# Patient Record
Sex: Male | Born: 1940 | Race: White | Hispanic: No | Marital: Married
Health system: Southern US, Community
[De-identification: ages and names within clinical notes are randomized; demographics above are authoritative.]

## PROBLEM LIST (undated history)

## (undated) HISTORY — PX: TONSILLECTOMY: SUR1361

---

## 2015-06-08 ENCOUNTER — Emergency Department (INDEPENDENT_AMBULATORY_CARE_PROVIDER_SITE_OTHER)
Admission: EM | Admit: 2015-06-08 | Discharge: 2015-06-08 | Disposition: A | Discharge status: Home / Self Care | Payer: Medicare HMO | Source: Home / Self Care | Attending: Emergency Medicine | Admitting: Emergency Medicine

## 2015-06-08 ENCOUNTER — Encounter (HOSPITAL_COMMUNITY): Payer: Self-pay | Admitting: *Deleted

## 2015-06-08 ENCOUNTER — Emergency Department (INDEPENDENT_AMBULATORY_CARE_PROVIDER_SITE_OTHER): Payer: Medicare HMO

## 2015-06-08 DIAGNOSIS — J189 Pneumonia, unspecified organism: Secondary | ICD-10-CM

## 2015-06-08 MED ORDER — LEVOFLOXACIN 500 MG PO TABS: 500.0000 mg | ORAL_TABLET | Freq: Every day | ORAL | Status: AC

## 2015-06-08 NOTE — ED Notes (Signed)
Pt  Reports  Symptoms  Of  sorethroat   Cough  /  Congested  Body  Aches   With onset  Of  Symptoms           Sine  Weds                Sitting  Upright  On  Exam table  Table        Speaking in   Complete     sentances

## 2015-06-08 NOTE — ED Provider Notes (Signed)
CSN: [id number]     Arrival date & time 06/08/15  1938 History   First MD Initiated Contact with Patient 06/08/15 2006     Chief Complaint  Patient presents with  . Sore Throat   (Consider location/radiation/quality/duration/timing/severity/associated sxs/prior Treatment) HPI  He is a 74 year old man here for evaluation of sore throat and fever. He states his symptoms started about 4-5 days ago. He reports nasal congestion, sore throat, and nonproductive cough. He reports fevers up to 103 at home. He reports a decreased appetite. No nausea or vomiting. He denies wheezing or shortness of breath. He tried over-the-counter cold medicines without improvement.  History reviewed. No pertinent past medical history. Past Surgical History  Procedure Laterality Date  . Tonsillectomy     History reviewed. No pertinent family history. Social History  Substance Use Topics  . Smoking status: None  . Smokeless tobacco: None  . Alcohol Use: No    Review of Systems As in history of present illness Allergies  Sulfa antibiotics  Home Medications   Prior to Admission medications   Medication Sig Start Date End Date Taking? Authorizing Provider  ASPIRIN PO Take by mouth.   Yes Historical Provider, MD  Multiple Vitamin (MULTIVITAMIN) tablet Take 1 tablet by mouth daily.   Yes Historical Provider, MD  levofloxacin (LEVAQUIN) 500 MG tablet Take 1 tablet (500 mg total) by mouth daily. 06/08/15   Melony Overly, MD   Meds Ordered and Administered this Visit  Medications - No data to display  BP 141/83 mmHg  Pulse 96  Temp(Src) 100.9 F (38.3 C) (Oral)  Resp 22  SpO2 95% No data found.   Physical Exam  Constitutional: He is oriented to person, place, and time. He appears well-developed and well-nourished. No distress.  HENT:  Nose: Nose normal.  Mouth/Throat: No oropharyngeal exudate.  Pharyngeal erythema  Neck: Neck supple.  Cardiovascular: Normal rate, regular rhythm and normal heart  sounds.   No murmur heard. Pulmonary/Chest: Effort normal and breath sounds normal. No respiratory distress. He has no wheezes. He has no rales.  Lymphadenopathy:    He has no cervical adenopathy.  Neurological: He is alert and oriented to person, place, and time.    ED Course  Procedures (including critical care time)  Labs Review Labs Reviewed - No data to display  Imaging Review No results found.   MDM   1. CAP (community acquired pneumonia)    Chest x-ray pending at time of discharge. On my read there is concern for a bronchopneumonia. Treat with Levaquin. Recommended OTC allergy medicine ingestion. Strict return precautions given. Follow-up with PCP next week for recheck.    Melony Overly, MD 06/08/15 2101

## 2015-06-08 NOTE — Discharge Instructions (Signed)
I am concerned about pneumonia on your x-ray. Take Levaquin daily for 7 days. You can take an over-the-counter allergy medicine such as Zyrtec or Claritin to help with the congestion. Please make an appointment with your primary care doctor next week for a recheck. If you are getting short of breath, your fevers persist, or you start acting funny, please go straight to the emergency room.

## 2015-09-09 DIAGNOSIS — H01001 Unspecified blepharitis right upper eyelid: Secondary | ICD-10-CM | POA: Diagnosis not present

## 2015-09-09 DIAGNOSIS — H01004 Unspecified blepharitis left upper eyelid: Secondary | ICD-10-CM | POA: Diagnosis not present

## 2015-09-12 DIAGNOSIS — H01001 Unspecified blepharitis right upper eyelid: Secondary | ICD-10-CM | POA: Diagnosis not present

## 2015-09-12 DIAGNOSIS — H01004 Unspecified blepharitis left upper eyelid: Secondary | ICD-10-CM | POA: Diagnosis not present

## 2015-09-23 DIAGNOSIS — Z23 Encounter for immunization: Secondary | ICD-10-CM | POA: Diagnosis not present

## 2015-09-23 DIAGNOSIS — Z9181 History of falling: Secondary | ICD-10-CM | POA: Diagnosis not present

## 2015-09-23 DIAGNOSIS — E782 Mixed hyperlipidemia: Secondary | ICD-10-CM | POA: Diagnosis not present

## 2015-09-23 DIAGNOSIS — Z79899 Other long term (current) drug therapy: Secondary | ICD-10-CM | POA: Diagnosis not present

## 2015-09-23 DIAGNOSIS — Z125 Encounter for screening for malignant neoplasm of prostate: Secondary | ICD-10-CM | POA: Diagnosis not present

## 2015-09-23 DIAGNOSIS — Z0000 Encounter for general adult medical examination without abnormal findings: Secondary | ICD-10-CM | POA: Diagnosis not present

## 2015-09-23 DIAGNOSIS — Z1389 Encounter for screening for other disorder: Secondary | ICD-10-CM | POA: Diagnosis not present

## 2015-10-02 DIAGNOSIS — J069 Acute upper respiratory infection, unspecified: Secondary | ICD-10-CM | POA: Diagnosis not present

## 2015-10-02 DIAGNOSIS — B9789 Other viral agents as the cause of diseases classified elsewhere: Secondary | ICD-10-CM | POA: Diagnosis not present

## 2015-10-08 DIAGNOSIS — R69 Illness, unspecified: Secondary | ICD-10-CM | POA: Diagnosis not present

## 2015-10-29 DIAGNOSIS — L570 Actinic keratosis: Secondary | ICD-10-CM | POA: Diagnosis not present

## 2015-12-02 DIAGNOSIS — J309 Allergic rhinitis, unspecified: Secondary | ICD-10-CM | POA: Diagnosis not present

## 2016-06-23 DIAGNOSIS — L570 Actinic keratosis: Secondary | ICD-10-CM | POA: Diagnosis not present

## 2016-07-21 DIAGNOSIS — Z23 Encounter for immunization: Secondary | ICD-10-CM | POA: Diagnosis not present

## 2016-09-08 DIAGNOSIS — J40 Bronchitis, not specified as acute or chronic: Secondary | ICD-10-CM | POA: Diagnosis not present

## 2016-09-08 DIAGNOSIS — J329 Chronic sinusitis, unspecified: Secondary | ICD-10-CM | POA: Diagnosis not present

## 2016-09-14 DIAGNOSIS — H5203 Hypermetropia, bilateral: Secondary | ICD-10-CM | POA: Diagnosis not present

## 2016-09-14 DIAGNOSIS — H2513 Age-related nuclear cataract, bilateral: Secondary | ICD-10-CM | POA: Diagnosis not present

## 2016-09-20 IMAGING — DX DG CHEST 2V
2 series · 2 of 2 positions shown · non-contrast
Comparison: None.

CLINICAL DATA: Acute onset of fever and cough. Decreased O2
saturation. Initial encounter.

EXAM:
CHEST  2 VIEW

[chest pa]
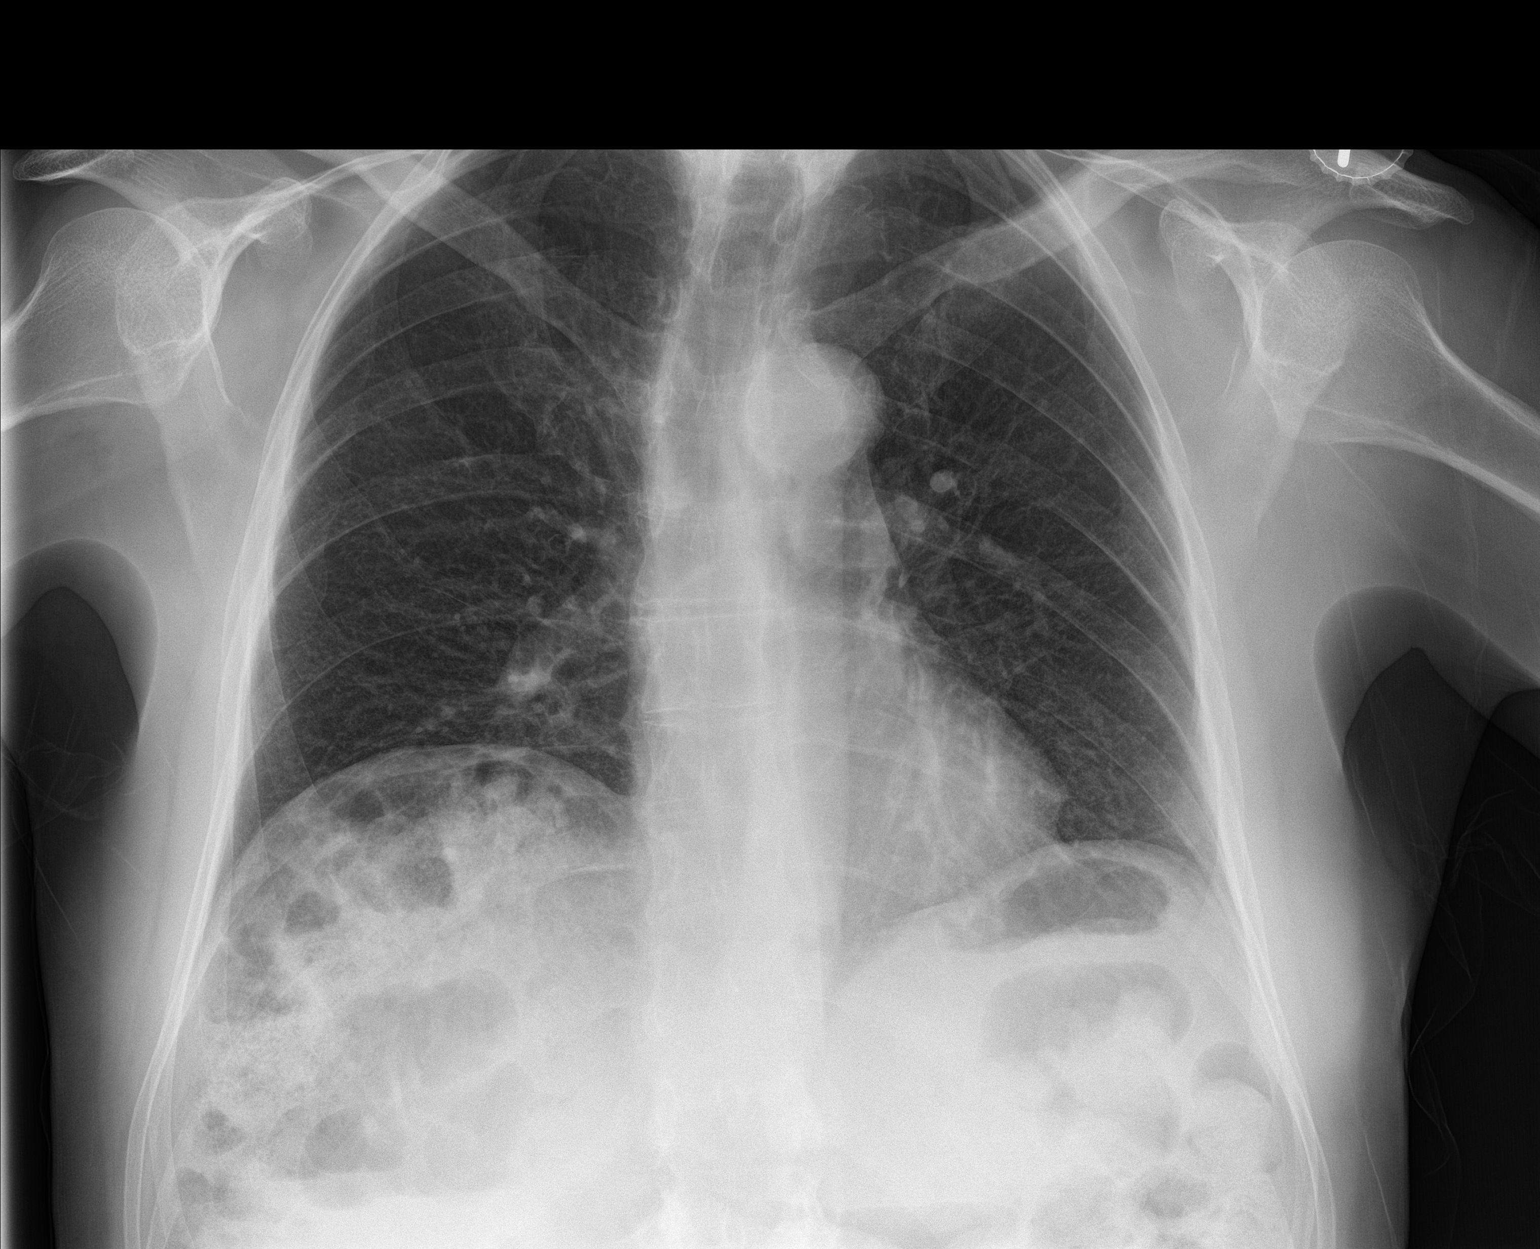

[chest lat]
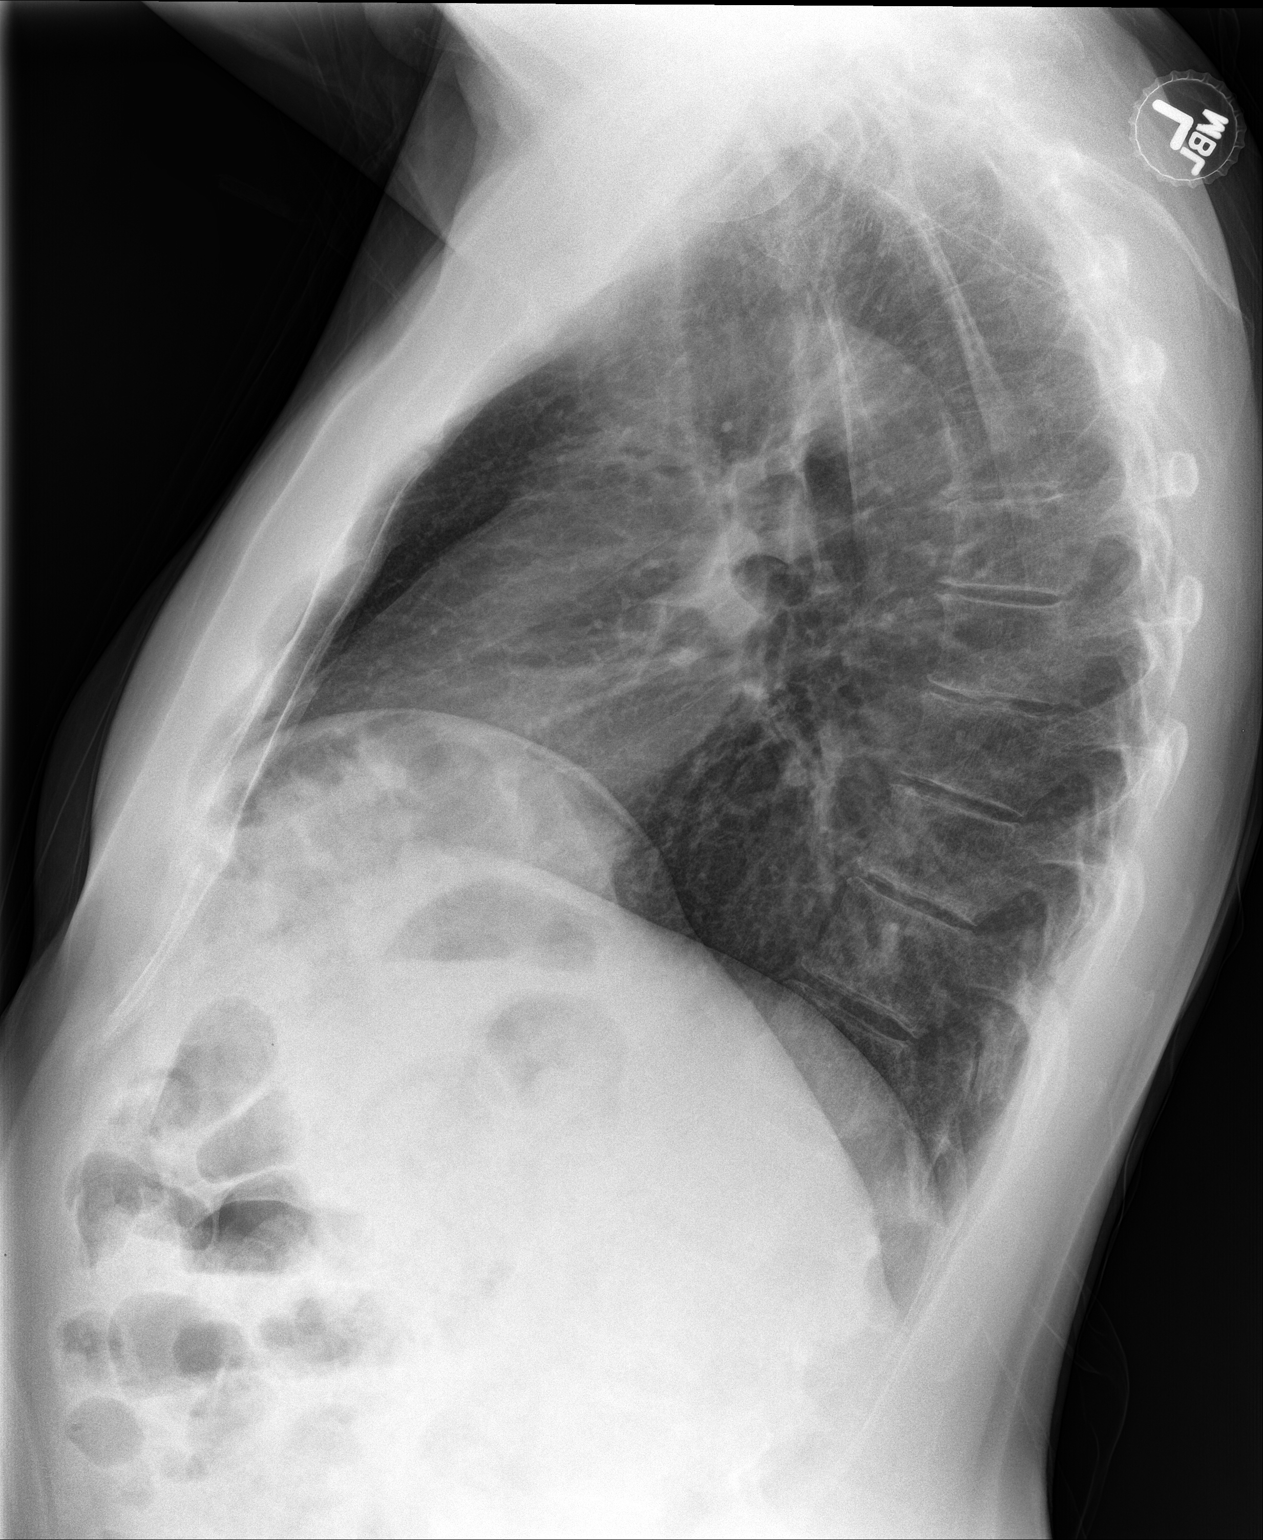

[2 of 2 positions shown; findings below may reference images not displayed]

FINDINGS: The lungs are well-aerated. Minimal bibasilar atelectasis is noted.
There is no evidence of pleural effusion or pneumothorax.

The heart is normal in size; the mediastinal contour is within
normal limits. No acute osseous abnormalities are seen. There is
interposition of colonic loops anterior to the liver.
IMPRESSION: Minimal bibasilar atelectasis noted.  Lungs otherwise clear.

## 2016-09-23 DIAGNOSIS — K219 Gastro-esophageal reflux disease without esophagitis: Secondary | ICD-10-CM | POA: Diagnosis not present

## 2016-09-23 DIAGNOSIS — Z9181 History of falling: Secondary | ICD-10-CM | POA: Diagnosis not present

## 2016-09-23 DIAGNOSIS — Z79899 Other long term (current) drug therapy: Secondary | ICD-10-CM | POA: Diagnosis not present

## 2016-09-23 DIAGNOSIS — E785 Hyperlipidemia, unspecified: Secondary | ICD-10-CM | POA: Diagnosis not present

## 2016-09-23 DIAGNOSIS — J309 Allergic rhinitis, unspecified: Secondary | ICD-10-CM | POA: Diagnosis not present

## 2016-09-23 DIAGNOSIS — Z1389 Encounter for screening for other disorder: Secondary | ICD-10-CM | POA: Diagnosis not present

## 2016-09-23 DIAGNOSIS — Z125 Encounter for screening for malignant neoplasm of prostate: Secondary | ICD-10-CM | POA: Diagnosis not present

## 2016-09-23 DIAGNOSIS — S86011A Strain of right Achilles tendon, initial encounter: Secondary | ICD-10-CM | POA: Diagnosis not present

## 2016-09-23 DIAGNOSIS — Z0000 Encounter for general adult medical examination without abnormal findings: Secondary | ICD-10-CM | POA: Diagnosis not present

## 2016-09-23 DIAGNOSIS — Z6822 Body mass index (BMI) 22.0-22.9, adult: Secondary | ICD-10-CM | POA: Diagnosis not present

## 2016-10-01 DIAGNOSIS — Z0000 Encounter for general adult medical examination without abnormal findings: Secondary | ICD-10-CM | POA: Diagnosis not present

## 2016-10-01 DIAGNOSIS — K219 Gastro-esophageal reflux disease without esophagitis: Secondary | ICD-10-CM | POA: Diagnosis not present

## 2016-10-01 DIAGNOSIS — Z6823 Body mass index (BMI) 23.0-23.9, adult: Secondary | ICD-10-CM | POA: Diagnosis not present

## 2016-11-04 DIAGNOSIS — Z0100 Encounter for examination of eyes and vision without abnormal findings: Secondary | ICD-10-CM | POA: Diagnosis not present

## 2016-11-05 DIAGNOSIS — 419620001 Death: Secondary | SNOMED CT | POA: Diagnosis not present

## 2016-11-05 DEATH — deceased

## 2017-04-13 DIAGNOSIS — I83819 Varicose veins of unspecified lower extremities with pain: Secondary | ICD-10-CM | POA: Diagnosis not present

## 2017-04-13 DIAGNOSIS — M79669 Pain in unspecified lower leg: Secondary | ICD-10-CM | POA: Diagnosis not present

## 2017-04-13 DIAGNOSIS — M7989 Other specified soft tissue disorders: Secondary | ICD-10-CM | POA: Diagnosis not present

## 2017-04-13 DIAGNOSIS — Z6823 Body mass index (BMI) 23.0-23.9, adult: Secondary | ICD-10-CM | POA: Diagnosis not present

## 2017-04-14 ENCOUNTER — Other Ambulatory Visit: Payer: Self-pay | Admitting: Family Medicine

## 2017-04-14 DIAGNOSIS — M79669 Pain in unspecified lower leg: Secondary | ICD-10-CM

## 2017-04-14 DIAGNOSIS — M7989 Other specified soft tissue disorders: Principal | ICD-10-CM

## 2017-04-16 ENCOUNTER — Ambulatory Visit
Admission: RE | Admit: 2017-04-16 | Discharge: 2017-04-16 | Disposition: A | Discharge status: Home / Self Care | Payer: Medicare HMO | Source: Ambulatory Visit | Attending: Family Medicine | Admitting: Family Medicine

## 2017-04-16 DIAGNOSIS — R600 Localized edema: Secondary | ICD-10-CM | POA: Diagnosis not present

## 2017-04-16 DIAGNOSIS — M79669 Pain in unspecified lower leg: Secondary | ICD-10-CM

## 2017-04-16 DIAGNOSIS — I8001 Phlebitis and thrombophlebitis of superficial vessels of right lower extremity: Secondary | ICD-10-CM | POA: Diagnosis not present

## 2017-04-16 DIAGNOSIS — M7989 Other specified soft tissue disorders: Principal | ICD-10-CM

## 2017-04-29 DIAGNOSIS — I8001 Phlebitis and thrombophlebitis of superficial vessels of right lower extremity: Secondary | ICD-10-CM | POA: Diagnosis not present

## 2017-04-29 DIAGNOSIS — I83813 Varicose veins of bilateral lower extremities with pain: Secondary | ICD-10-CM | POA: Diagnosis not present

## 2017-04-29 DIAGNOSIS — I83893 Varicose veins of bilateral lower extremities with other complications: Secondary | ICD-10-CM | POA: Diagnosis not present

## 2017-05-04 DIAGNOSIS — I8001 Phlebitis and thrombophlebitis of superficial vessels of right lower extremity: Secondary | ICD-10-CM | POA: Diagnosis not present

## 2017-05-04 DIAGNOSIS — I83813 Varicose veins of bilateral lower extremities with pain: Secondary | ICD-10-CM | POA: Diagnosis not present

## 2017-05-04 DIAGNOSIS — I83893 Varicose veins of bilateral lower extremities with other complications: Secondary | ICD-10-CM | POA: Diagnosis not present

## 2017-05-25 DIAGNOSIS — I83893 Varicose veins of bilateral lower extremities with other complications: Secondary | ICD-10-CM | POA: Diagnosis not present

## 2017-05-25 DIAGNOSIS — I83813 Varicose veins of bilateral lower extremities with pain: Secondary | ICD-10-CM | POA: Diagnosis not present

## 2017-05-25 DIAGNOSIS — I8312 Varicose veins of left lower extremity with inflammation: Secondary | ICD-10-CM | POA: Diagnosis not present

## 2017-05-25 DIAGNOSIS — I8311 Varicose veins of right lower extremity with inflammation: Secondary | ICD-10-CM | POA: Diagnosis not present

## 2017-06-14 DIAGNOSIS — Z6823 Body mass index (BMI) 23.0-23.9, adult: Secondary | ICD-10-CM | POA: Diagnosis not present

## 2017-06-14 DIAGNOSIS — E785 Hyperlipidemia, unspecified: Secondary | ICD-10-CM | POA: Diagnosis not present

## 2017-06-14 DIAGNOSIS — R509 Fever, unspecified: Secondary | ICD-10-CM | POA: Diagnosis not present

## 2017-06-14 DIAGNOSIS — Z79899 Other long term (current) drug therapy: Secondary | ICD-10-CM | POA: Diagnosis not present

## 2017-06-16 DIAGNOSIS — I8311 Varicose veins of right lower extremity with inflammation: Secondary | ICD-10-CM | POA: Diagnosis not present

## 2017-06-16 DIAGNOSIS — I83891 Varicose veins of right lower extremities with other complications: Secondary | ICD-10-CM | POA: Diagnosis not present

## 2017-06-21 DIAGNOSIS — I8311 Varicose veins of right lower extremity with inflammation: Secondary | ICD-10-CM | POA: Diagnosis not present

## 2017-07-02 DIAGNOSIS — I8311 Varicose veins of right lower extremity with inflammation: Secondary | ICD-10-CM | POA: Diagnosis not present

## 2017-07-02 DIAGNOSIS — I83891 Varicose veins of right lower extremities with other complications: Secondary | ICD-10-CM | POA: Diagnosis not present

## 2017-07-14 DIAGNOSIS — I8311 Varicose veins of right lower extremity with inflammation: Secondary | ICD-10-CM | POA: Diagnosis not present

## 2017-07-14 DIAGNOSIS — I83811 Varicose veins of right lower extremities with pain: Secondary | ICD-10-CM | POA: Diagnosis not present

## 2017-07-29 DIAGNOSIS — I83891 Varicose veins of right lower extremities with other complications: Secondary | ICD-10-CM | POA: Diagnosis not present

## 2017-07-29 DIAGNOSIS — I8311 Varicose veins of right lower extremity with inflammation: Secondary | ICD-10-CM | POA: Diagnosis not present

## 2017-09-03 DIAGNOSIS — Z6823 Body mass index (BMI) 23.0-23.9, adult: Secondary | ICD-10-CM | POA: Diagnosis not present

## 2017-09-03 DIAGNOSIS — S81801A Unspecified open wound, right lower leg, initial encounter: Secondary | ICD-10-CM | POA: Diagnosis not present

## 2017-09-09 DIAGNOSIS — Z23 Encounter for immunization: Secondary | ICD-10-CM | POA: Diagnosis not present

## 2017-09-09 DIAGNOSIS — R69 Illness, unspecified: Secondary | ICD-10-CM | POA: Diagnosis not present

## 2017-09-16 DIAGNOSIS — H5203 Hypermetropia, bilateral: Secondary | ICD-10-CM | POA: Diagnosis not present

## 2017-09-16 DIAGNOSIS — H2513 Age-related nuclear cataract, bilateral: Secondary | ICD-10-CM | POA: Diagnosis not present

## 2017-10-26 DIAGNOSIS — Z79899 Other long term (current) drug therapy: Secondary | ICD-10-CM | POA: Diagnosis not present

## 2017-10-26 DIAGNOSIS — Z9181 History of falling: Secondary | ICD-10-CM | POA: Diagnosis not present

## 2017-10-26 DIAGNOSIS — Z125 Encounter for screening for malignant neoplasm of prostate: Secondary | ICD-10-CM | POA: Diagnosis not present

## 2017-10-26 DIAGNOSIS — Z6822 Body mass index (BMI) 22.0-22.9, adult: Secondary | ICD-10-CM | POA: Diagnosis not present

## 2017-10-26 DIAGNOSIS — Z1331 Encounter for screening for depression: Secondary | ICD-10-CM | POA: Diagnosis not present

## 2017-10-26 DIAGNOSIS — E785 Hyperlipidemia, unspecified: Secondary | ICD-10-CM | POA: Diagnosis not present

## 2017-10-26 DIAGNOSIS — Z0000 Encounter for general adult medical examination without abnormal findings: Secondary | ICD-10-CM | POA: Diagnosis not present

## 2017-10-26 DIAGNOSIS — K219 Gastro-esophageal reflux disease without esophagitis: Secondary | ICD-10-CM | POA: Diagnosis not present

## 2017-10-26 DIAGNOSIS — Z1211 Encounter for screening for malignant neoplasm of colon: Secondary | ICD-10-CM | POA: Diagnosis not present

## 2017-11-02 DIAGNOSIS — R58 Hemorrhage, not elsewhere classified: Secondary | ICD-10-CM | POA: Diagnosis not present

## 2017-11-02 DIAGNOSIS — I8311 Varicose veins of right lower extremity with inflammation: Secondary | ICD-10-CM | POA: Diagnosis not present

## 2017-11-08 DIAGNOSIS — R58 Hemorrhage, not elsewhere classified: Secondary | ICD-10-CM | POA: Diagnosis not present

## 2017-11-08 DIAGNOSIS — I8311 Varicose veins of right lower extremity with inflammation: Secondary | ICD-10-CM | POA: Diagnosis not present

## 2017-11-25 DIAGNOSIS — I8311 Varicose veins of right lower extremity with inflammation: Secondary | ICD-10-CM | POA: Diagnosis not present

## 2017-11-25 DIAGNOSIS — R58 Hemorrhage, not elsewhere classified: Secondary | ICD-10-CM | POA: Diagnosis not present

## 2017-12-30 DIAGNOSIS — I8311 Varicose veins of right lower extremity with inflammation: Secondary | ICD-10-CM | POA: Diagnosis not present

## 2017-12-30 DIAGNOSIS — R58 Hemorrhage, not elsewhere classified: Secondary | ICD-10-CM | POA: Diagnosis not present

## 2018-04-13 DIAGNOSIS — Z1339 Encounter for screening examination for other mental health and behavioral disorders: Secondary | ICD-10-CM | POA: Diagnosis not present

## 2018-04-13 DIAGNOSIS — J309 Allergic rhinitis, unspecified: Secondary | ICD-10-CM | POA: Diagnosis not present

## 2018-04-13 DIAGNOSIS — R69 Illness, unspecified: Secondary | ICD-10-CM | POA: Diagnosis not present

## 2018-04-13 DIAGNOSIS — Z6823 Body mass index (BMI) 23.0-23.9, adult: Secondary | ICD-10-CM | POA: Diagnosis not present

## 2018-04-30 IMAGING — US US EXTREM LOW VENOUS BILAT
1 series · 12 of 24 positions shown · non-contrast
Comparison: None.

CLINICAL DATA: Bilateral lower extremity pain and edema for the
past week. Palpable area involving the right mid calf.



[Series 1: us extrem low venous bilat · 0.09mm/px · 12 of 88 slices shown]
[im 4/88]
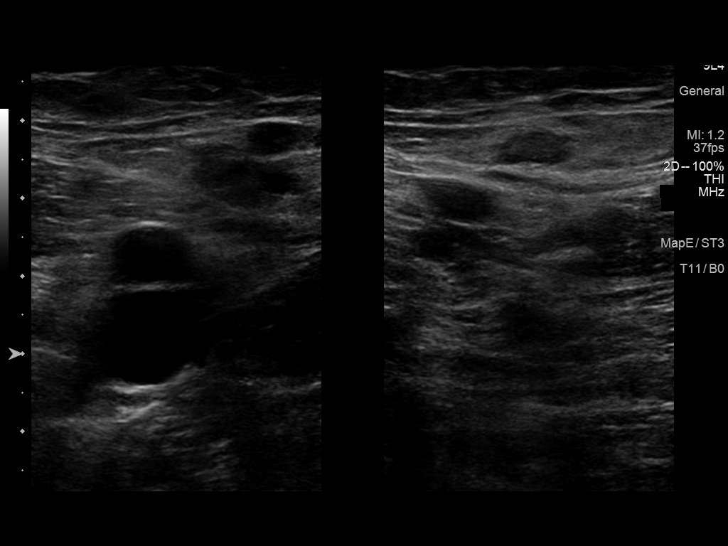
[im 12/88]
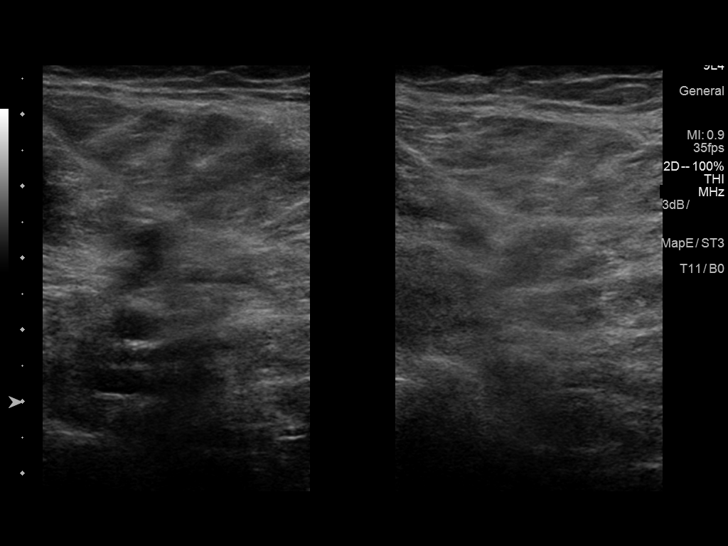
[im 19/88]
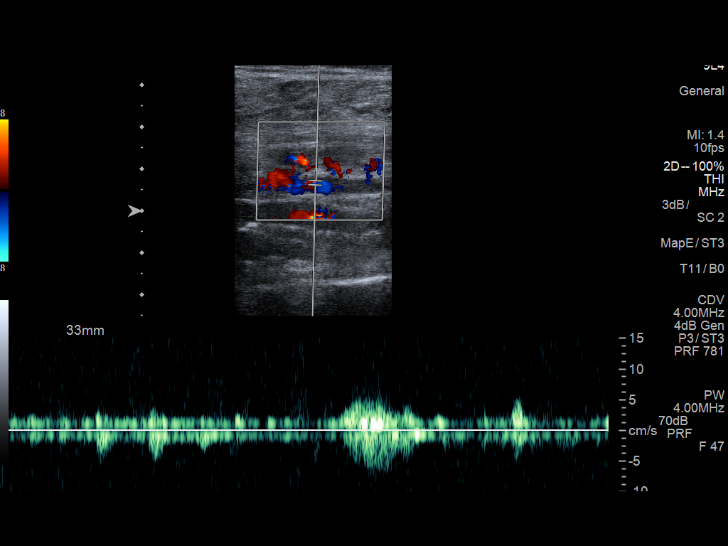
[im 27/88]
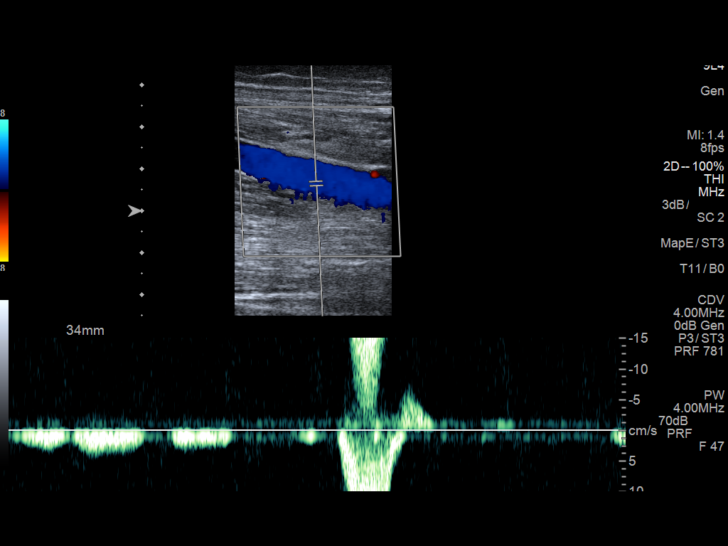
[im 35/88]
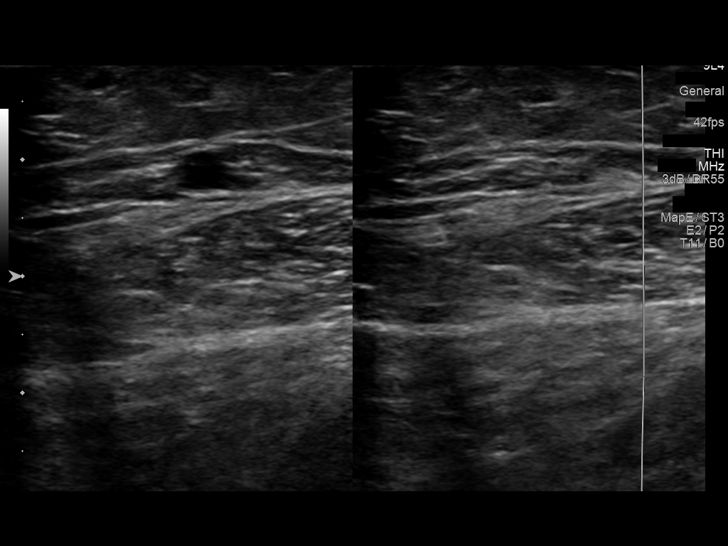
[im 42/88]
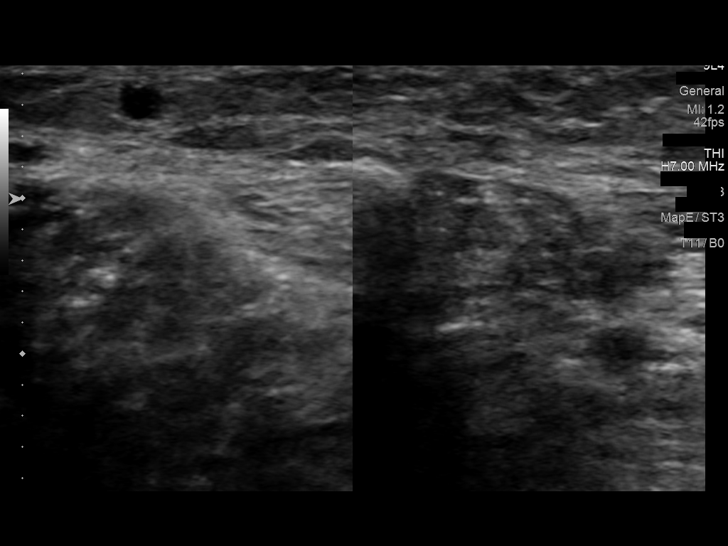
[im 50/88]
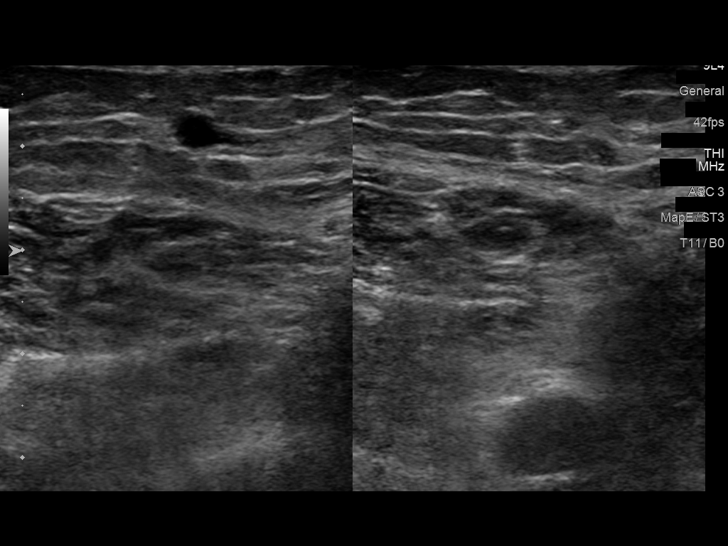
[im 57/88]
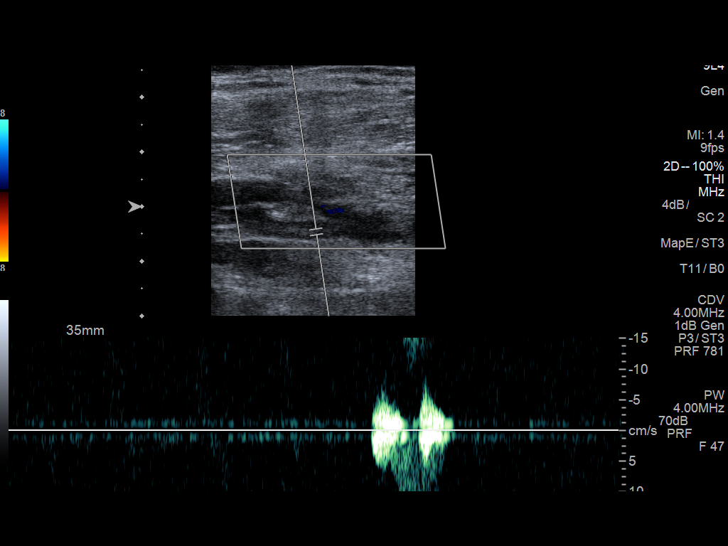
[im 65/88]
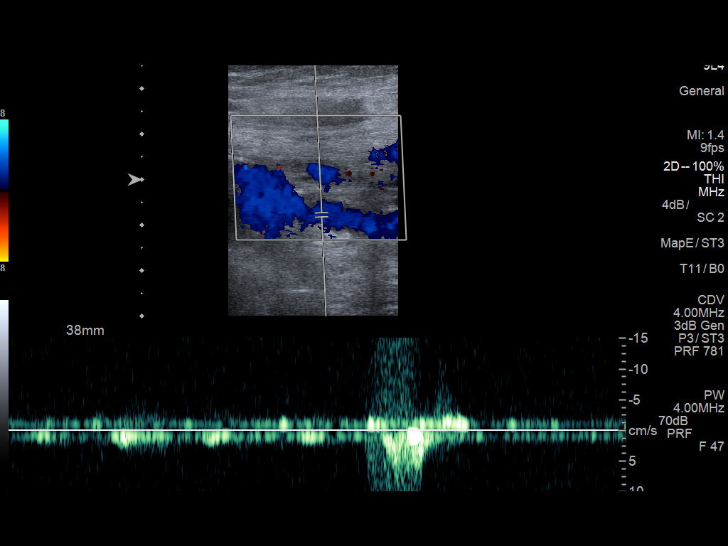
[im 72/88]
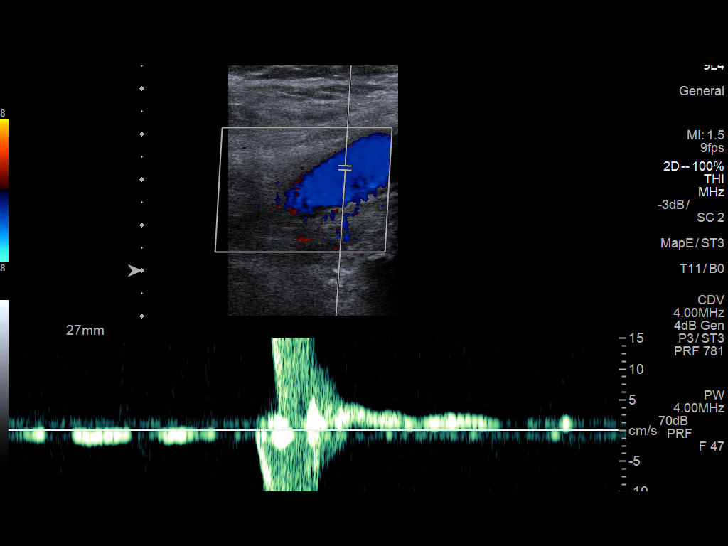
[im 80/88]
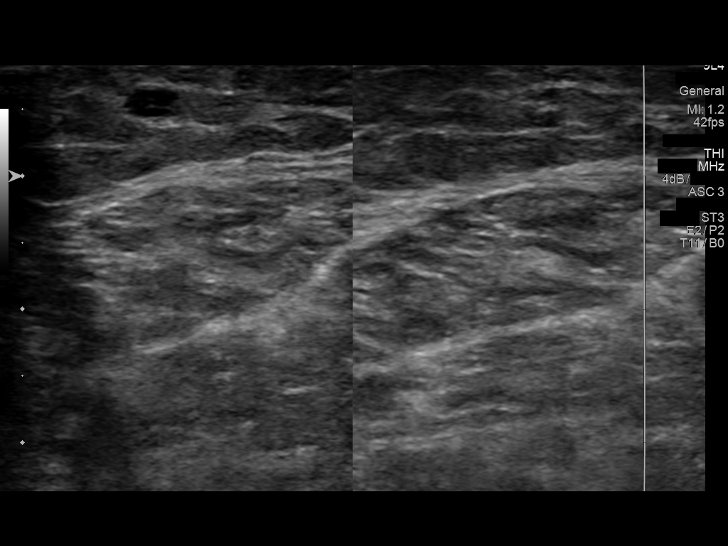
[im 88/88]
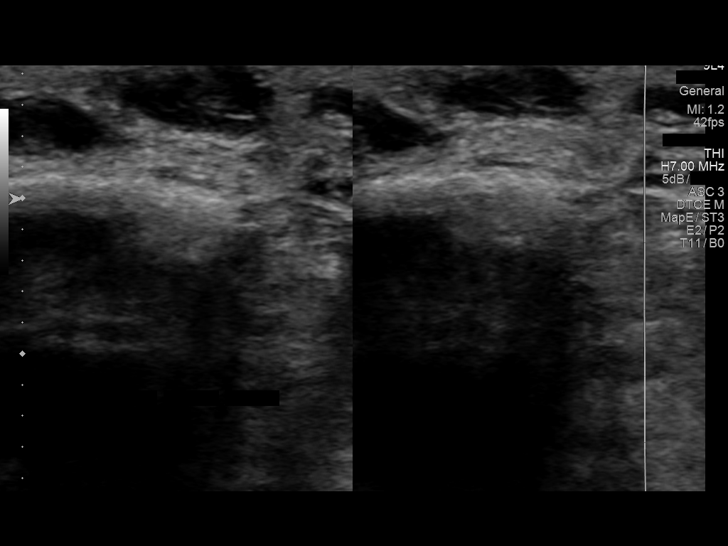

[12 of 24 positions shown; findings below may reference images not displayed]

FINDINGS: RIGHT LOWER EXTREMITY

Common Femoral Vein: No evidence of thrombus. Normal
compressibility, respiratory phasicity and response to augmentation.

Saphenofemoral Junction: No evidence of thrombus. Normal
compressibility and flow on color Doppler imaging.

Profunda Femoral Vein: No evidence of thrombus. Normal
compressibility and flow on color Doppler imaging.

Femoral Vein: No evidence of thrombus. Normal compressibility,
respiratory phasicity and response to augmentation.

Popliteal Vein: No evidence of thrombus. Normal compressibility,
respiratory phasicity and response to augmentation.

Calf Veins: No evidence of thrombus. Normal compressibility and flow
on color Doppler imaging.

Superficial Great Saphenous Vein: No evidence of thrombus. Normal
compressibility and flow on color Doppler imaging.

Venous Reflux:  None.

Other Findings: Mixed echogenic occlusive thrombus is noted within
several superficial varicosities at the level the right mid calf,
correlating with the patient's palpable area of concern
(representative images 90 - 93).

LEFT LOWER EXTREMITY

Common Femoral Vein: No evidence of thrombus. Normal
compressibility, respiratory phasicity and response to augmentation.

Saphenofemoral Junction: No evidence of thrombus. Normal
compressibility and flow on color Doppler imaging.

Profunda Femoral Vein: No evidence of thrombus. Normal
compressibility and flow on color Doppler imaging.

Femoral Vein: No evidence of thrombus. Normal compressibility,
respiratory phasicity and response to augmentation.

Popliteal Vein: No evidence of thrombus. Normal compressibility,
respiratory phasicity and response to augmentation.

Calf Veins: No evidence of thrombus. Normal compressibility and flow
on color Doppler imaging.

Superficial Great Saphenous Vein: No evidence of thrombus. Normal
compressibility and flow on color Doppler imaging.

Venous Reflux:  None.

Other Findings:  None.
IMPRESSION: 1. No evidence of DVT within either lower extremity.
2. Examination is positive for age-indeterminate occlusive
superficial thrombophlebitis involving several prominent superficial
varicosities within the mid aspect of the right calf. Again, there
is no extension of this occlusive superficial thrombophlebitis to
the deep venous system of the right lower extremity.

## 2018-08-04 DIAGNOSIS — Z23 Encounter for immunization: Secondary | ICD-10-CM | POA: Diagnosis not present

## 2018-09-19 DIAGNOSIS — H5203 Hypermetropia, bilateral: Secondary | ICD-10-CM | POA: Diagnosis not present

## 2018-09-19 DIAGNOSIS — H2513 Age-related nuclear cataract, bilateral: Secondary | ICD-10-CM | POA: Diagnosis not present

## 2018-10-06 DIAGNOSIS — L570 Actinic keratosis: Secondary | ICD-10-CM | POA: Diagnosis not present

## 2018-10-06 DIAGNOSIS — L821 Other seborrheic keratosis: Secondary | ICD-10-CM | POA: Diagnosis not present

## 2018-10-12 DIAGNOSIS — K222 Esophageal obstruction: Secondary | ICD-10-CM | POA: Diagnosis not present

## 2018-10-12 DIAGNOSIS — K219 Gastro-esophageal reflux disease without esophagitis: Secondary | ICD-10-CM | POA: Diagnosis not present

## 2018-10-13 DIAGNOSIS — I8311 Varicose veins of right lower extremity with inflammation: Secondary | ICD-10-CM | POA: Diagnosis not present

## 2018-10-13 DIAGNOSIS — R58 Hemorrhage, not elsewhere classified: Secondary | ICD-10-CM | POA: Diagnosis not present

## 2018-10-28 DIAGNOSIS — K210 Gastro-esophageal reflux disease with esophagitis: Secondary | ICD-10-CM | POA: Diagnosis not present

## 2018-10-28 DIAGNOSIS — Z8711 Personal history of peptic ulcer disease: Secondary | ICD-10-CM | POA: Diagnosis not present

## 2018-10-28 DIAGNOSIS — Z86010 Personal history of colonic polyps: Secondary | ICD-10-CM | POA: Diagnosis not present

## 2018-10-28 DIAGNOSIS — K648 Other hemorrhoids: Secondary | ICD-10-CM | POA: Diagnosis not present

## 2018-10-28 DIAGNOSIS — K222 Esophageal obstruction: Secondary | ICD-10-CM | POA: Diagnosis not present

## 2018-10-28 DIAGNOSIS — K449 Diaphragmatic hernia without obstruction or gangrene: Secondary | ICD-10-CM | POA: Diagnosis not present

## 2018-10-28 DIAGNOSIS — K644 Residual hemorrhoidal skin tags: Secondary | ICD-10-CM | POA: Diagnosis not present

## 2018-10-28 DIAGNOSIS — K5900 Constipation, unspecified: Secondary | ICD-10-CM | POA: Diagnosis not present

## 2018-10-28 DIAGNOSIS — Z1211 Encounter for screening for malignant neoplasm of colon: Secondary | ICD-10-CM | POA: Diagnosis not present

## 2018-10-28 DIAGNOSIS — K219 Gastro-esophageal reflux disease without esophagitis: Secondary | ICD-10-CM | POA: Diagnosis not present

## 2018-10-28 DIAGNOSIS — R1310 Dysphagia, unspecified: Secondary | ICD-10-CM | POA: Diagnosis not present

## 2018-10-28 DIAGNOSIS — K5730 Diverticulosis of large intestine without perforation or abscess without bleeding: Secondary | ICD-10-CM | POA: Diagnosis not present

## 2018-10-28 DIAGNOSIS — Z800 Family history of malignant neoplasm of digestive organs: Secondary | ICD-10-CM | POA: Diagnosis not present

## 2018-11-01 DIAGNOSIS — Z0000 Encounter for general adult medical examination without abnormal findings: Secondary | ICD-10-CM | POA: Diagnosis not present

## 2018-11-01 DIAGNOSIS — K219 Gastro-esophageal reflux disease without esophagitis: Secondary | ICD-10-CM | POA: Diagnosis not present

## 2018-11-01 DIAGNOSIS — Z79899 Other long term (current) drug therapy: Secondary | ICD-10-CM | POA: Diagnosis not present

## 2018-11-01 DIAGNOSIS — S61431A Puncture wound without foreign body of right hand, initial encounter: Secondary | ICD-10-CM | POA: Diagnosis not present

## 2018-11-01 DIAGNOSIS — J309 Allergic rhinitis, unspecified: Secondary | ICD-10-CM | POA: Diagnosis not present

## 2018-11-01 DIAGNOSIS — R739 Hyperglycemia, unspecified: Secondary | ICD-10-CM | POA: Diagnosis not present

## 2018-11-01 DIAGNOSIS — Z125 Encounter for screening for malignant neoplasm of prostate: Secondary | ICD-10-CM | POA: Diagnosis not present

## 2018-11-01 DIAGNOSIS — Z1331 Encounter for screening for depression: Secondary | ICD-10-CM | POA: Diagnosis not present

## 2018-11-01 DIAGNOSIS — E785 Hyperlipidemia, unspecified: Secondary | ICD-10-CM | POA: Diagnosis not present

## 2018-11-01 DIAGNOSIS — Z6823 Body mass index (BMI) 23.0-23.9, adult: Secondary | ICD-10-CM | POA: Diagnosis not present

## 2018-12-08 DIAGNOSIS — J309 Allergic rhinitis, unspecified: Secondary | ICD-10-CM | POA: Diagnosis not present

## 2019-03-10 DIAGNOSIS — J309 Allergic rhinitis, unspecified: Secondary | ICD-10-CM | POA: Diagnosis not present

## 2019-03-10 DIAGNOSIS — H109 Unspecified conjunctivitis: Secondary | ICD-10-CM | POA: Diagnosis not present

## 2019-03-10 DIAGNOSIS — Z6823 Body mass index (BMI) 23.0-23.9, adult: Secondary | ICD-10-CM | POA: Diagnosis not present

## 2019-03-10 DIAGNOSIS — H0014 Chalazion left upper eyelid: Secondary | ICD-10-CM | POA: Diagnosis not present

## 2019-03-13 DIAGNOSIS — H00014 Hordeolum externum left upper eyelid: Secondary | ICD-10-CM | POA: Diagnosis not present

## 2019-07-08 DIAGNOSIS — L570 Actinic keratosis: Secondary | ICD-10-CM | POA: Diagnosis not present

## 2019-07-08 DIAGNOSIS — L814 Other melanin hyperpigmentation: Secondary | ICD-10-CM | POA: Diagnosis not present

## 2019-07-08 DIAGNOSIS — L821 Other seborrheic keratosis: Secondary | ICD-10-CM | POA: Diagnosis not present

## 2019-07-08 DIAGNOSIS — D485 Neoplasm of uncertain behavior of skin: Secondary | ICD-10-CM | POA: Diagnosis not present

## 2019-07-27 DIAGNOSIS — Z23 Encounter for immunization: Secondary | ICD-10-CM | POA: Diagnosis not present

## 2019-10-02 DIAGNOSIS — H5203 Hypermetropia, bilateral: Secondary | ICD-10-CM | POA: Diagnosis not present

## 2019-10-02 DIAGNOSIS — H2513 Age-related nuclear cataract, bilateral: Secondary | ICD-10-CM | POA: Diagnosis not present

## 2019-10-02 DIAGNOSIS — H524 Presbyopia: Secondary | ICD-10-CM | POA: Diagnosis not present

## 2019-11-30 DIAGNOSIS — Z125 Encounter for screening for malignant neoplasm of prostate: Secondary | ICD-10-CM | POA: Diagnosis not present

## 2019-11-30 DIAGNOSIS — Z6823 Body mass index (BMI) 23.0-23.9, adult: Secondary | ICD-10-CM | POA: Diagnosis not present

## 2019-11-30 DIAGNOSIS — K219 Gastro-esophageal reflux disease without esophagitis: Secondary | ICD-10-CM | POA: Diagnosis not present

## 2019-11-30 DIAGNOSIS — Z0000 Encounter for general adult medical examination without abnormal findings: Secondary | ICD-10-CM | POA: Diagnosis not present

## 2019-11-30 DIAGNOSIS — E785 Hyperlipidemia, unspecified: Secondary | ICD-10-CM | POA: Diagnosis not present

## 2019-11-30 DIAGNOSIS — J309 Allergic rhinitis, unspecified: Secondary | ICD-10-CM | POA: Diagnosis not present

## 2019-11-30 DIAGNOSIS — R739 Hyperglycemia, unspecified: Secondary | ICD-10-CM | POA: Diagnosis not present

## 2019-12-14 DIAGNOSIS — J309 Allergic rhinitis, unspecified: Secondary | ICD-10-CM | POA: Diagnosis not present

## 2020-02-15 DIAGNOSIS — Z0100 Encounter for examination of eyes and vision without abnormal findings: Secondary | ICD-10-CM | POA: Diagnosis not present

## 2020-03-26 DIAGNOSIS — R69 Illness, unspecified: Secondary | ICD-10-CM | POA: Diagnosis not present

## 2020-09-04 DIAGNOSIS — J309 Allergic rhinitis, unspecified: Secondary | ICD-10-CM | POA: Diagnosis not present

## 2020-09-05 DIAGNOSIS — Z23 Encounter for immunization: Secondary | ICD-10-CM | POA: Diagnosis not present

## 2020-10-03 DIAGNOSIS — H5203 Hypermetropia, bilateral: Secondary | ICD-10-CM | POA: Diagnosis not present

## 2020-10-03 DIAGNOSIS — H5213 Myopia, bilateral: Secondary | ICD-10-CM | POA: Diagnosis not present

## 2020-10-14 DIAGNOSIS — J069 Acute upper respiratory infection, unspecified: Secondary | ICD-10-CM | POA: Diagnosis not present

## 2020-10-14 DIAGNOSIS — Z20828 Contact with and (suspected) exposure to other viral communicable diseases: Secondary | ICD-10-CM | POA: Diagnosis not present

## 2020-12-03 DIAGNOSIS — Z125 Encounter for screening for malignant neoplasm of prostate: Secondary | ICD-10-CM | POA: Diagnosis not present

## 2020-12-03 DIAGNOSIS — E785 Hyperlipidemia, unspecified: Secondary | ICD-10-CM | POA: Diagnosis not present

## 2020-12-03 DIAGNOSIS — Z9181 History of falling: Secondary | ICD-10-CM | POA: Diagnosis not present

## 2020-12-03 DIAGNOSIS — Z0000 Encounter for general adult medical examination without abnormal findings: Secondary | ICD-10-CM | POA: Diagnosis not present

## 2020-12-03 DIAGNOSIS — J301 Allergic rhinitis due to pollen: Secondary | ICD-10-CM | POA: Diagnosis not present

## 2020-12-03 DIAGNOSIS — R7302 Impaired glucose tolerance (oral): Secondary | ICD-10-CM | POA: Diagnosis not present

## 2020-12-03 DIAGNOSIS — Z6822 Body mass index (BMI) 22.0-22.9, adult: Secondary | ICD-10-CM | POA: Diagnosis not present

## 2020-12-03 DIAGNOSIS — Z79899 Other long term (current) drug therapy: Secondary | ICD-10-CM | POA: Diagnosis not present

## 2020-12-03 DIAGNOSIS — K2960 Other gastritis without bleeding: Secondary | ICD-10-CM | POA: Diagnosis not present

## 2020-12-03 DIAGNOSIS — Z1331 Encounter for screening for depression: Secondary | ICD-10-CM | POA: Diagnosis not present

## 2021-03-19 DIAGNOSIS — T7840XA Allergy, unspecified, initial encounter: Secondary | ICD-10-CM | POA: Diagnosis not present

## 2021-07-10 DIAGNOSIS — J301 Allergic rhinitis due to pollen: Secondary | ICD-10-CM | POA: Diagnosis not present

## 2021-08-11 DIAGNOSIS — Z23 Encounter for immunization: Secondary | ICD-10-CM | POA: Diagnosis not present

## 2021-09-26 DIAGNOSIS — R030 Elevated blood-pressure reading, without diagnosis of hypertension: Secondary | ICD-10-CM | POA: Diagnosis not present

## 2021-09-26 DIAGNOSIS — Z8249 Family history of ischemic heart disease and other diseases of the circulatory system: Secondary | ICD-10-CM | POA: Diagnosis not present

## 2021-09-26 DIAGNOSIS — K219 Gastro-esophageal reflux disease without esophagitis: Secondary | ICD-10-CM | POA: Diagnosis not present

## 2021-09-26 DIAGNOSIS — Z85828 Personal history of other malignant neoplasm of skin: Secondary | ICD-10-CM | POA: Diagnosis not present

## 2021-09-26 DIAGNOSIS — Z7982 Long term (current) use of aspirin: Secondary | ICD-10-CM | POA: Diagnosis not present

## 2021-10-06 DIAGNOSIS — H25013 Cortical age-related cataract, bilateral: Secondary | ICD-10-CM | POA: Diagnosis not present

## 2021-10-06 DIAGNOSIS — H2513 Age-related nuclear cataract, bilateral: Secondary | ICD-10-CM | POA: Diagnosis not present

## 2021-10-06 DIAGNOSIS — H524 Presbyopia: Secondary | ICD-10-CM | POA: Diagnosis not present

## 2021-10-06 DIAGNOSIS — H5203 Hypermetropia, bilateral: Secondary | ICD-10-CM | POA: Diagnosis not present

## 2021-10-10 DIAGNOSIS — Z0100 Encounter for examination of eyes and vision without abnormal findings: Secondary | ICD-10-CM | POA: Diagnosis not present

## 2021-11-10 DIAGNOSIS — M79622 Pain in left upper arm: Secondary | ICD-10-CM | POA: Diagnosis not present

## 2021-11-10 DIAGNOSIS — Z6822 Body mass index (BMI) 22.0-22.9, adult: Secondary | ICD-10-CM | POA: Diagnosis not present

## 2021-11-10 DIAGNOSIS — J301 Allergic rhinitis due to pollen: Secondary | ICD-10-CM | POA: Diagnosis not present

## 2021-12-08 DIAGNOSIS — J301 Allergic rhinitis due to pollen: Secondary | ICD-10-CM | POA: Diagnosis not present

## 2022-01-05 DIAGNOSIS — Z1331 Encounter for screening for depression: Secondary | ICD-10-CM | POA: Diagnosis not present

## 2022-01-05 DIAGNOSIS — Z9181 History of falling: Secondary | ICD-10-CM | POA: Diagnosis not present

## 2022-01-05 DIAGNOSIS — Z79899 Other long term (current) drug therapy: Secondary | ICD-10-CM | POA: Diagnosis not present

## 2022-01-05 DIAGNOSIS — K2960 Other gastritis without bleeding: Secondary | ICD-10-CM | POA: Diagnosis not present

## 2022-01-05 DIAGNOSIS — Z0000 Encounter for general adult medical examination without abnormal findings: Secondary | ICD-10-CM | POA: Diagnosis not present

## 2022-01-05 DIAGNOSIS — R7302 Impaired glucose tolerance (oral): Secondary | ICD-10-CM | POA: Diagnosis not present

## 2022-01-05 DIAGNOSIS — Z125 Encounter for screening for malignant neoplasm of prostate: Secondary | ICD-10-CM | POA: Diagnosis not present

## 2022-01-05 DIAGNOSIS — E785 Hyperlipidemia, unspecified: Secondary | ICD-10-CM | POA: Diagnosis not present

## 2022-01-05 DIAGNOSIS — Z6821 Body mass index (BMI) 21.0-21.9, adult: Secondary | ICD-10-CM | POA: Diagnosis not present

## 2022-01-05 DIAGNOSIS — J301 Allergic rhinitis due to pollen: Secondary | ICD-10-CM | POA: Diagnosis not present

## 2022-03-11 DIAGNOSIS — T7840XA Allergy, unspecified, initial encounter: Secondary | ICD-10-CM | POA: Diagnosis not present

## 2022-06-18 DIAGNOSIS — T7840XA Allergy, unspecified, initial encounter: Secondary | ICD-10-CM | POA: Diagnosis not present

## 2022-06-30 DIAGNOSIS — Z6821 Body mass index (BMI) 21.0-21.9, adult: Secondary | ICD-10-CM | POA: Diagnosis not present

## 2022-06-30 DIAGNOSIS — E785 Hyperlipidemia, unspecified: Secondary | ICD-10-CM | POA: Diagnosis not present

## 2022-06-30 DIAGNOSIS — R233 Spontaneous ecchymoses: Secondary | ICD-10-CM | POA: Diagnosis not present

## 2022-09-25 DIAGNOSIS — R1084 Generalized abdominal pain: Secondary | ICD-10-CM | POA: Diagnosis not present

## 2022-09-25 DIAGNOSIS — K5900 Constipation, unspecified: Secondary | ICD-10-CM | POA: Diagnosis not present

## 2022-09-25 DIAGNOSIS — I700 Atherosclerosis of aorta: Secondary | ICD-10-CM | POA: Diagnosis not present

## 2022-09-25 DIAGNOSIS — K3189 Other diseases of stomach and duodenum: Secondary | ICD-10-CM | POA: Diagnosis not present

## 2022-10-03 DIAGNOSIS — J069 Acute upper respiratory infection, unspecified: Secondary | ICD-10-CM | POA: Diagnosis not present

## 2022-10-03 DIAGNOSIS — R0981 Nasal congestion: Secondary | ICD-10-CM | POA: Diagnosis not present

## 2022-10-03 DIAGNOSIS — Z20822 Contact with and (suspected) exposure to covid-19: Secondary | ICD-10-CM | POA: Diagnosis not present

## 2022-10-07 DIAGNOSIS — R059 Cough, unspecified: Secondary | ICD-10-CM | POA: Diagnosis not present

## 2022-10-07 DIAGNOSIS — U071 COVID-19: Secondary | ICD-10-CM | POA: Diagnosis not present

## 2022-10-15 DIAGNOSIS — H2513 Age-related nuclear cataract, bilateral: Secondary | ICD-10-CM | POA: Diagnosis not present

## 2022-10-15 DIAGNOSIS — H25013 Cortical age-related cataract, bilateral: Secondary | ICD-10-CM | POA: Diagnosis not present

## 2022-10-15 DIAGNOSIS — H524 Presbyopia: Secondary | ICD-10-CM | POA: Diagnosis not present

## 2022-11-02 DIAGNOSIS — L570 Actinic keratosis: Secondary | ICD-10-CM | POA: Diagnosis not present

## 2022-11-02 DIAGNOSIS — L821 Other seborrheic keratosis: Secondary | ICD-10-CM | POA: Diagnosis not present

## 2022-11-02 DIAGNOSIS — L578 Other skin changes due to chronic exposure to nonionizing radiation: Secondary | ICD-10-CM | POA: Diagnosis not present

## 2022-11-26 DIAGNOSIS — R0981 Nasal congestion: Secondary | ICD-10-CM | POA: Diagnosis not present

## 2023-01-18 DIAGNOSIS — Z125 Encounter for screening for malignant neoplasm of prostate: Secondary | ICD-10-CM | POA: Diagnosis not present

## 2023-01-18 DIAGNOSIS — Z13220 Encounter for screening for lipoid disorders: Secondary | ICD-10-CM | POA: Diagnosis not present

## 2023-01-18 DIAGNOSIS — M542 Cervicalgia: Secondary | ICD-10-CM | POA: Diagnosis not present

## 2023-01-18 DIAGNOSIS — R5383 Other fatigue: Secondary | ICD-10-CM | POA: Diagnosis not present

## 2023-01-18 DIAGNOSIS — Z0000 Encounter for general adult medical examination without abnormal findings: Secondary | ICD-10-CM | POA: Diagnosis not present

## 2023-01-26 DIAGNOSIS — L608 Other nail disorders: Secondary | ICD-10-CM | POA: Diagnosis not present

## 2023-01-26 DIAGNOSIS — L600 Ingrowing nail: Secondary | ICD-10-CM | POA: Diagnosis not present

## 2023-02-04 DIAGNOSIS — L600 Ingrowing nail: Secondary | ICD-10-CM | POA: Diagnosis not present

## 2023-02-04 DIAGNOSIS — L608 Other nail disorders: Secondary | ICD-10-CM | POA: Diagnosis not present

## 2023-03-02 DIAGNOSIS — L578 Other skin changes due to chronic exposure to nonionizing radiation: Secondary | ICD-10-CM | POA: Diagnosis not present

## 2023-03-02 DIAGNOSIS — L821 Other seborrheic keratosis: Secondary | ICD-10-CM | POA: Diagnosis not present

## 2023-03-02 DIAGNOSIS — L570 Actinic keratosis: Secondary | ICD-10-CM | POA: Diagnosis not present

## 2023-06-22 DIAGNOSIS — Z809 Family history of malignant neoplasm, unspecified: Secondary | ICD-10-CM | POA: Diagnosis not present

## 2023-06-22 DIAGNOSIS — K219 Gastro-esophageal reflux disease without esophagitis: Secondary | ICD-10-CM | POA: Diagnosis not present

## 2023-06-22 DIAGNOSIS — H269 Unspecified cataract: Secondary | ICD-10-CM | POA: Diagnosis not present

## 2023-06-22 DIAGNOSIS — Z008 Encounter for other general examination: Secondary | ICD-10-CM | POA: Diagnosis not present

## 2023-06-22 DIAGNOSIS — E785 Hyperlipidemia, unspecified: Secondary | ICD-10-CM | POA: Diagnosis not present

## 2023-06-22 DIAGNOSIS — J301 Allergic rhinitis due to pollen: Secondary | ICD-10-CM | POA: Diagnosis not present

## 2023-06-22 DIAGNOSIS — K5900 Constipation, unspecified: Secondary | ICD-10-CM | POA: Diagnosis not present

## 2023-06-22 DIAGNOSIS — Z7982 Long term (current) use of aspirin: Secondary | ICD-10-CM | POA: Diagnosis not present

## 2023-06-22 DIAGNOSIS — I10 Essential (primary) hypertension: Secondary | ICD-10-CM | POA: Diagnosis not present

## 2023-06-22 DIAGNOSIS — I700 Atherosclerosis of aorta: Secondary | ICD-10-CM | POA: Diagnosis not present

## 2023-06-22 DIAGNOSIS — Z8249 Family history of ischemic heart disease and other diseases of the circulatory system: Secondary | ICD-10-CM | POA: Diagnosis not present

## 2023-07-09 DIAGNOSIS — R0981 Nasal congestion: Secondary | ICD-10-CM | POA: Diagnosis not present

## 2023-07-23 DIAGNOSIS — I10 Essential (primary) hypertension: Secondary | ICD-10-CM | POA: Diagnosis not present

## 2023-08-05 DIAGNOSIS — Z23 Encounter for immunization: Secondary | ICD-10-CM | POA: Diagnosis not present

## 2023-09-05 DIAGNOSIS — R69 Illness, unspecified: Secondary | ICD-10-CM | POA: Diagnosis not present

## 2023-09-06 DIAGNOSIS — L578 Other skin changes due to chronic exposure to nonionizing radiation: Secondary | ICD-10-CM | POA: Diagnosis not present

## 2023-09-06 DIAGNOSIS — L570 Actinic keratosis: Secondary | ICD-10-CM | POA: Diagnosis not present

## 2023-09-06 DIAGNOSIS — C44222 Squamous cell carcinoma of skin of right ear and external auricular canal: Secondary | ICD-10-CM | POA: Diagnosis not present

## 2023-09-06 DIAGNOSIS — L821 Other seborrheic keratosis: Secondary | ICD-10-CM | POA: Diagnosis not present

## 2023-09-28 DIAGNOSIS — Z87442 Personal history of urinary calculi: Secondary | ICD-10-CM | POA: Diagnosis not present

## 2023-09-28 DIAGNOSIS — E860 Dehydration: Secondary | ICD-10-CM | POA: Diagnosis not present

## 2023-09-28 DIAGNOSIS — K851 Biliary acute pancreatitis without necrosis or infection: Secondary | ICD-10-CM | POA: Diagnosis not present

## 2023-09-28 DIAGNOSIS — E162 Hypoglycemia, unspecified: Secondary | ICD-10-CM | POA: Diagnosis not present

## 2023-09-28 DIAGNOSIS — R7401 Elevation of levels of liver transaminase levels: Secondary | ICD-10-CM | POA: Diagnosis not present

## 2023-09-28 DIAGNOSIS — R9431 Abnormal electrocardiogram [ECG] [EKG]: Secondary | ICD-10-CM | POA: Diagnosis not present

## 2023-09-28 DIAGNOSIS — D72825 Bandemia: Secondary | ICD-10-CM | POA: Diagnosis not present

## 2023-09-28 DIAGNOSIS — E8809 Other disorders of plasma-protein metabolism, not elsewhere classified: Secondary | ICD-10-CM | POA: Diagnosis not present

## 2023-09-28 DIAGNOSIS — R109 Unspecified abdominal pain: Secondary | ICD-10-CM | POA: Diagnosis not present

## 2023-09-28 DIAGNOSIS — K219 Gastro-esophageal reflux disease without esophagitis: Secondary | ICD-10-CM | POA: Diagnosis not present

## 2023-09-28 DIAGNOSIS — Z79899 Other long term (current) drug therapy: Secondary | ICD-10-CM | POA: Diagnosis not present

## 2023-09-28 DIAGNOSIS — R1013 Epigastric pain: Secondary | ICD-10-CM | POA: Diagnosis not present

## 2023-09-28 DIAGNOSIS — D649 Anemia, unspecified: Secondary | ICD-10-CM | POA: Diagnosis not present

## 2023-09-28 DIAGNOSIS — Z7982 Long term (current) use of aspirin: Secondary | ICD-10-CM | POA: Diagnosis not present

## 2023-09-28 DIAGNOSIS — Z882 Allergy status to sulfonamides status: Secondary | ICD-10-CM | POA: Diagnosis not present

## 2023-09-28 DIAGNOSIS — K859 Acute pancreatitis without necrosis or infection, unspecified: Secondary | ICD-10-CM | POA: Diagnosis not present

## 2023-09-28 DIAGNOSIS — K8020 Calculus of gallbladder without cholecystitis without obstruction: Secondary | ICD-10-CM | POA: Diagnosis not present

## 2023-09-28 DIAGNOSIS — I10 Essential (primary) hypertension: Secondary | ICD-10-CM | POA: Diagnosis not present

## 2023-09-28 DIAGNOSIS — E871 Hypo-osmolality and hyponatremia: Secondary | ICD-10-CM | POA: Diagnosis not present

## 2023-09-28 DIAGNOSIS — E778 Other disorders of glycoprotein metabolism: Secondary | ICD-10-CM | POA: Diagnosis not present

## 2023-09-28 DIAGNOSIS — E876 Hypokalemia: Secondary | ICD-10-CM | POA: Diagnosis not present

## 2023-10-02 DIAGNOSIS — K8020 Calculus of gallbladder without cholecystitis without obstruction: Secondary | ICD-10-CM | POA: Diagnosis not present

## 2023-10-02 DIAGNOSIS — K859 Acute pancreatitis without necrosis or infection, unspecified: Secondary | ICD-10-CM | POA: Diagnosis not present
